# Patient Record
Sex: Female | Born: 1975 | Race: White | Hispanic: No | Smoking: Never smoker
Health system: Southern US, Community
[De-identification: ages and names within clinical notes are randomized; demographics above are authoritative.]

---

## 2009-06-11 ENCOUNTER — Emergency Department (HOSPITAL_BASED_OUTPATIENT_CLINIC_OR_DEPARTMENT_OTHER): Admission: EM | Admit: 2009-06-11 | Discharge: 2009-06-11 | Payer: Self-pay | Admitting: Emergency Medicine

## 2009-06-11 ENCOUNTER — Ambulatory Visit: Payer: Self-pay | Admitting: Diagnostic Radiology

## 2012-02-02 ENCOUNTER — Emergency Department (HOSPITAL_COMMUNITY)
Admission: EM | Admit: 2012-02-02 | Discharge: 2012-02-02 | Disposition: A | Discharge status: Home / Self Care | Payer: Self-pay | Attending: Emergency Medicine | Admitting: Emergency Medicine

## 2012-02-02 ENCOUNTER — Encounter (HOSPITAL_COMMUNITY): Payer: Self-pay | Admitting: *Deleted

## 2012-02-02 DIAGNOSIS — J302 Other seasonal allergic rhinitis: Secondary | ICD-10-CM

## 2012-02-02 DIAGNOSIS — H5710 Ocular pain, unspecified eye: Secondary | ICD-10-CM | POA: Insufficient documentation

## 2012-02-02 DIAGNOSIS — R070 Pain in throat: Secondary | ICD-10-CM | POA: Insufficient documentation

## 2012-02-02 DIAGNOSIS — Z79899 Other long term (current) drug therapy: Secondary | ICD-10-CM | POA: Insufficient documentation

## 2012-02-02 DIAGNOSIS — R0982 Postnasal drip: Secondary | ICD-10-CM | POA: Insufficient documentation

## 2012-02-02 DIAGNOSIS — H5789 Other specified disorders of eye and adnexa: Secondary | ICD-10-CM | POA: Insufficient documentation

## 2012-02-02 DIAGNOSIS — J309 Allergic rhinitis, unspecified: Secondary | ICD-10-CM | POA: Insufficient documentation

## 2012-02-02 DIAGNOSIS — H109 Unspecified conjunctivitis: Secondary | ICD-10-CM | POA: Insufficient documentation

## 2012-02-02 DIAGNOSIS — R6889 Other general symptoms and signs: Secondary | ICD-10-CM | POA: Insufficient documentation

## 2012-02-02 DIAGNOSIS — R51 Headache: Secondary | ICD-10-CM | POA: Insufficient documentation

## 2012-02-02 DIAGNOSIS — J3489 Other specified disorders of nose and nasal sinuses: Secondary | ICD-10-CM | POA: Insufficient documentation

## 2012-02-02 DIAGNOSIS — R220 Localized swelling, mass and lump, head: Secondary | ICD-10-CM | POA: Insufficient documentation

## 2012-02-02 MED ORDER — TOBRAMYCIN 0.3 % OP SOLN
2.0000 [drp] | Freq: Four times a day (QID) | OPHTHALMIC | Status: DC
  Administered 2012-02-02: 2 [drp] via OPHTHALMIC
  Filled 2012-02-02: qty 5

## 2012-02-02 MED ORDER — TRIAMCINOLONE ACETONIDE(NASAL) 55 MCG/ACT NA INHA: 2.0000 | Freq: Every day | NASAL | Status: DC

## 2012-02-02 MED ORDER — HYDROCODONE-ACETAMINOPHEN 5-325 MG PO TABS
1.0000 | ORAL_TABLET | Freq: Once | ORAL | Status: AC
  Administered 2012-02-02: 1 via ORAL
  Filled 2012-02-02: qty 1

## 2012-02-02 NOTE — ED Provider Notes (Signed)
History     CSN: [id number]  Arrival date & time 02/02/12  1152   First MD Initiated Contact with Patient 02/02/12 1238      Chief Complaint  Patient presents with  . Eye Pain    (Consider location/radiation/quality/duration/timing/severity/associated sxs/prior treatment) Patient is a 36 y.o. female presenting with eye pain. The history is provided by the patient.  Eye Pain This is a new problem. The current episode started in the past 7 days. The problem occurs constantly. The problem has been gradually worsening. Associated symptoms include congestion, headaches and a sore throat. Pertinent negatives include no coughing, fever or nausea. Associated symptoms comments: Right eye pain worse than left, but symptoms in both of redness, pain and morning drainage and crusting. No blurred vision. She reports headache behind eyes, congestion and sneezing typical of her allergy symptoms. No fever. Intermittent sore throat from "drainage". .    History reviewed. No pertinent past medical history.  Past Surgical History  Procedure Date  . Cesarean section     No family history on file.  History  Substance Use Topics  . Smoking status: Never Smoker   . Smokeless tobacco: Not on file  . Alcohol Use: Yes     occasionally    OB History    Grav Para Term Preterm Abortions TAB SAB Ect Mult Living                  Review of Systems  Constitutional: Negative for fever.  HENT: Positive for congestion, sore throat, sneezing, postnasal drip and sinus pressure.   Eyes: Positive for pain and discharge. Negative for visual disturbance.  Respiratory: Negative for cough.   Gastrointestinal: Negative for nausea.  Neurological: Positive for headaches.    Allergies  Review of patient's allergies indicates no known allergies.  Home Medications   Current Outpatient Rx  Name Route Sig Dispense Refill  . ASPIRIN-ACETAMINOPHEN-CAFFEINE 250-250-65 MG PO TABS Oral Take 1 tablet by mouth every  6 (six) hours as needed. pain    . DIPHENHYDRAMINE HCL 25 MG PO CAPS Oral Take 25 mg by mouth every 6 (six) hours as needed. allergies    . DIPHENHYDRAMINE-APAP (SLEEP) 25-500 MG PO TABS Oral Take 1 tablet by mouth at bedtime as needed.    Marland Kitchen NAPROXEN SODIUM 220 MG PO TABS Oral Take 220 mg by mouth 2 (two) times daily with a meal.    . TETRAHYDROZOLINE-ZN SULFATE 0.05-0.25 % OP SOLN Both Eyes Place 1 drop into both eyes 3 (three) times daily as needed. Red eyes      BP 105/75  Pulse 69  Temp(Src) 98.1 F (36.7 C) (Oral)  Resp 16  SpO2 100%  LMP 01/13/2012  Physical Exam  Constitutional: She is oriented to person, place, and time. She appears well-developed and well-nourished.  HENT:  Head: Normocephalic.  Eyes: Pupils are equal, round, and reactive to light. Right eye exhibits no discharge. Left eye exhibits no discharge.       Right eye significantly red wtihout drainage currently. Lids are not swollen. Nasal mucosa red and edematous. Sinus tenderness and increased pressure sensation with palpation.  Neck: Normal range of motion. Neck supple.  Cardiovascular: Normal rate and regular rhythm.   Pulmonary/Chest: Effort normal and breath sounds normal.  Abdominal: Soft. Bowel sounds are normal. There is no tenderness. There is no rebound and no guarding.  Musculoskeletal: Normal range of motion.  Neurological: She is alert and oriented to person, place, and time. She has normal strength  and normal reflexes. No sensory deficit. She displays a negative Romberg sign. Coordination normal.  Skin: Skin is warm and dry. No rash noted.  Psychiatric: She has a normal mood and affect.    ED Course  Procedures (including critical care time)  Labs Reviewed - No data to display No results found.   No diagnosis found. 1. conjunctivits 2. Seasonal allergic rhinitis    MDM  Patient with a history of significant allergies that mimic current symptoms with eye redness, morning crusting  consistent with conjunctivitis. Probably allergic, however, will treat based on significance of presentation with Tobrex, other supportive care recommendations.        Rodena Medin, PA-C 02/02/12 1415

## 2012-02-02 NOTE — ED Notes (Signed)
Started with right eye pain and redness 2 days ago. Has had drainage in the mornings. No known injury.

## 2012-02-02 NOTE — ED Notes (Signed)
Pt wears contacts and has them in at this time.

## 2012-02-02 NOTE — Discharge Instructions (Signed)
USE 2-3 DROPS INTO EYE (TOBREX) 4 TIMES DAILY FOR THE NEXT 3-4 DAYS. RECOMMEND PLAIN SALINE NASAL SPRAYS IN ADDITION TO NASACORT AS DIRECTED. CONTINUE ZYRTEC, RECOMMEND WITH DECONGESTANT. RETURN HERE WITH ANY HIGH FEVER, SEVERE PAIN OR NEW CONCERN. IT IS IMPORTANT TO LEAVE THE CONTACTS OUT FOR THE DURATION OF TREATMENT.   Allergic Rhinitis Allergic rhinitis is when the mucous membranes in the nose respond to allergens. Allergens are particles in the air that cause your body to have an allergic reaction. This causes you to release allergic antibodies. Through a chain of events, these eventually cause you to release histamine into the blood stream (hence the use of antihistamines). Although meant to be protective to the body, it is this release that causes your discomfort, such as frequent sneezing, congestion and an itchy runny nose.  CAUSES  The pollen allergens may come from grasses, trees, and weeds. This is seasonal allergic rhinitis, or "hay fever." Other allergens cause year-round allergic rhinitis (perennial allergic rhinitis) such as house dust mite allergen, pet dander and mold spores.  SYMPTOMS   Nasal stuffiness (congestion).   Runny, itchy nose with sneezing and tearing of the eyes.   There is often an itching of the mouth, eyes and ears.  It cannot be cured, but it can be controlled with medications. DIAGNOSIS  If you are unable to determine the offending allergen, skin or blood testing may find it. TREATMENT   Avoid the allergen.   Medications and allergy shots (immunotherapy) can help.   Hay fever may often be treated with antihistamines in pill or nasal spray forms. Antihistamines block the effects of histamine. There are over-the-counter medicines that may help with nasal congestion and swelling around the eyes. Check with your caregiver before taking or giving this medicine.  If the treatment above does not work, there are many new medications your caregiver can prescribe.  Stronger medications may be used if initial measures are ineffective. Desensitizing injections can be used if medications and avoidance fails. Desensitization is when a patient is given ongoing shots until the body becomes less sensitive to the allergen. Make sure you follow up with your caregiver if problems continue. SEEK MEDICAL CARE IF:   You develop fever (more than 100.5 F (38.1 C).   You develop a cough that does not stop easily (persistent).   You have shortness of breath.   You start wheezing.   Symptoms interfere with normal daily activities.  Document Released: 06/16/2001 Document Revised: 09/10/2011 Document Reviewed: 12/26/2008 [facility] Patient Information 2012 [place], [place].  Conjunctivitis Conjunctivitis is commonly called "pink eye." Conjunctivitis can be caused by bacterial or viral infection, allergies, or injuries. There is usually redness of the lining of the eye, itching, discomfort, and sometimes discharge. There may be deposits of matter along the eyelids. A viral infection usually causes a watery discharge, while a bacterial infection causes a yellowish, thick discharge. Pink eye is very contagious and spreads by direct contact. You may be given antibiotic eyedrops as part of your treatment. Before using your eye medicine, remove all drainage from the eye by washing gently with warm water and cotton balls. Continue to use the medication until you have awakened 2 mornings in a row without discharge from the eye. Do not rub your eye. This increases the irritation and helps spread infection. Use separate towels from other household members. Wash your hands with soap and water before and after touching your eyes. Use cold compresses to reduce pain and sunglasses to relieve irritation  from light. Do not wear contact lenses or wear eye makeup until the infection is gone. SEEK MEDICAL CARE IF:   Your symptoms are not better after 3 days of treatment.   You have increased  pain or trouble seeing.   The outer eyelids become very red or swollen.  Document Released: 10/29/2004 Document Revised: 09/10/2011 Document Reviewed: 09/21/2005 [facility] Patient Information 2012 [place], [place].

## 2012-02-02 NOTE — ED Provider Notes (Signed)
Medical screening examination/treatment/procedure(s) were performed by non-physician practitioner and as supervising physician I was immediately available for consultation/collaboration.   Nat Christen, MD 02/02/12 1524

## 2012-02-02 NOTE — ED Notes (Signed)
Pt reports bil eye pain, redness, itching, sensitivity to light x2 days. Reports eyes "crust over" when sleeping. Pain causing migraine.

## 2012-07-26 ENCOUNTER — Encounter (HOSPITAL_COMMUNITY): Payer: Self-pay | Admitting: *Deleted

## 2012-07-26 DIAGNOSIS — J3489 Other specified disorders of nose and nasal sinuses: Secondary | ICD-10-CM | POA: Insufficient documentation

## 2012-07-26 DIAGNOSIS — Z79899 Other long term (current) drug therapy: Secondary | ICD-10-CM | POA: Insufficient documentation

## 2012-07-26 DIAGNOSIS — IMO0001 Reserved for inherently not codable concepts without codable children: Secondary | ICD-10-CM | POA: Insufficient documentation

## 2012-07-26 DIAGNOSIS — R05 Cough: Secondary | ICD-10-CM | POA: Insufficient documentation

## 2012-07-26 DIAGNOSIS — J189 Pneumonia, unspecified organism: Secondary | ICD-10-CM | POA: Insufficient documentation

## 2012-07-26 DIAGNOSIS — R059 Cough, unspecified: Secondary | ICD-10-CM | POA: Insufficient documentation

## 2012-07-26 DIAGNOSIS — R509 Fever, unspecified: Secondary | ICD-10-CM | POA: Insufficient documentation

## 2012-07-26 DIAGNOSIS — N390 Urinary tract infection, site not specified: Secondary | ICD-10-CM | POA: Insufficient documentation

## 2012-07-26 LAB — CBC WITH DIFFERENTIAL/PLATELET
Basophils Absolute: 0 10*3/uL (ref 0.0–0.1)
Basophils Relative: 0 % (ref 0–1)
Eosinophils Absolute: 0.1 10*3/uL (ref 0.0–0.7)
Hemoglobin: 13.2 g/dL (ref 12.0–15.0)
MCH: 29.9 pg (ref 26.0–34.0)
MCHC: 34.2 g/dL (ref 30.0–36.0)
Monocytes Absolute: 1.6 10*3/uL — ABNORMAL HIGH (ref 0.1–1.0)
Monocytes Relative: 17 % — ABNORMAL HIGH (ref 3–12)
Neutro Abs: 5.8 10*3/uL (ref 1.7–7.7)
Neutrophils Relative %: 62 % (ref 43–77)
RDW: 12.5 % (ref 11.5–15.5)

## 2012-07-26 MED ORDER — IBUPROFEN 200 MG PO TABS
600.0000 mg | ORAL_TABLET | Freq: Once | ORAL | Status: AC
  Administered 2012-07-26: 600 mg via ORAL
  Filled 2012-07-26: qty 3

## 2012-07-26 NOTE — ED Notes (Signed)
Pt reports cough, congestion, fever x several days.  Took tylenol (unsure of amount) at 9pm tonight.  Pt noted to be coughing.

## 2012-07-27 ENCOUNTER — Emergency Department (HOSPITAL_COMMUNITY)
Admission: EM | Admit: 2012-07-27 | Discharge: 2012-07-27 | Disposition: A | Discharge status: Home / Self Care | Payer: Self-pay | Attending: Emergency Medicine | Admitting: Emergency Medicine

## 2012-07-27 ENCOUNTER — Emergency Department (HOSPITAL_COMMUNITY): Payer: Self-pay

## 2012-07-27 DIAGNOSIS — N390 Urinary tract infection, site not specified: Secondary | ICD-10-CM

## 2012-07-27 DIAGNOSIS — J189 Pneumonia, unspecified organism: Secondary | ICD-10-CM

## 2012-07-27 LAB — POCT I-STAT, CHEM 8
BUN: <3 mg/dL — ABNORMAL LOW (ref 6–23)
Calcium, Ion: 1.12 mmol/L (ref 1.12–1.23)
Creatinine, Ser: 0.8 mg/dL (ref 0.50–1.10)
Glucose, Bld: 99 mg/dL (ref 70–99)
Hemoglobin: 12.6 g/dL (ref 12.0–15.0)
TCO2: 24 mmol/L (ref 0–100)

## 2012-07-27 LAB — URINALYSIS, ROUTINE W REFLEX MICROSCOPIC
Bilirubin Urine: NEGATIVE
Hgb urine dipstick: NEGATIVE
Nitrite: NEGATIVE
Protein, ur: NEGATIVE mg/dL
Specific Gravity, Urine: 1.002 — ABNORMAL LOW (ref 1.005–1.030)
Urobilinogen, UA: 1 mg/dL (ref 0.0–1.0)

## 2012-07-27 MED ORDER — SODIUM CHLORIDE 0.9 % IV BOLUS (SEPSIS)
1000.0000 mL | Freq: Once | INTRAVENOUS | Status: AC
  Administered 2012-07-27: 1000 mL via INTRAVENOUS

## 2012-07-27 MED ORDER — LEVOFLOXACIN 750 MG PO TABS
750.0000 mg | ORAL_TABLET | Freq: Every day | ORAL | Status: DC
  Administered 2012-07-27: 750 mg via ORAL
  Filled 2012-07-27: qty 1

## 2012-07-27 MED ORDER — LEVOFLOXACIN 500 MG PO TABS: 500.0000 mg | ORAL_TABLET | Freq: Every day | ORAL | Status: AC

## 2012-07-27 MED ORDER — DEXTROSE 5 % IV SOLN: 1.0000 g | Freq: Once | INTRAVENOUS | Status: DC

## 2012-07-27 MED ORDER — HYDROCODONE-ACETAMINOPHEN 5-325 MG PO TABS
1.0000 | ORAL_TABLET | Freq: Once | ORAL | Status: AC
  Administered 2012-07-27: 1 via ORAL
  Filled 2012-07-27: qty 1

## 2012-07-27 MED ORDER — DEXTROSE 5 % IV SOLN
1.0000 g | Freq: Once | INTRAVENOUS | Status: AC
  Administered 2012-07-27: 1 g via INTRAVENOUS
  Filled 2012-07-27: qty 10

## 2012-07-27 MED ORDER — HYDROCODONE-ACETAMINOPHEN 5-500 MG PO TABS: 1.0000 | ORAL_TABLET | Freq: Four times a day (QID) | ORAL | Status: AC | PRN

## 2012-07-27 NOTE — ED Provider Notes (Signed)
History     CSN: [id number]  Arrival date & time 07/26/12  2212   First MD Initiated Contact with Patient 07/27/12 0036      Chief Complaint  Patient presents with  . URI    (Consider location/radiation/quality/duration/timing/severity/associated sxs/prior treatment) Patient is a 36 y.o. female presenting with URI. The history is provided by the patient.  URI The primary symptoms include fever, sore throat, cough and myalgias. The current episode started 3 to 5 days ago. This is a new problem. The problem has not changed since onset. The fever began 3 to 5 days ago. The fever has been unchanged since its onset. The maximum temperature recorded prior to her arrival was 102 to 102.9 F.  The sore throat began today. The sore throat has been unchanged since its onset. The sore throat is moderate in intensity.  The cough began yesterday. The cough is productive. The sputum is green and yellow.  Myalgias began 2 days ago. The myalgias have been unchanged since their onset. The myalgias are generalized. The discomfort from the myalgias is moderate.  Symptoms associated with the illness include chills and congestion.    History reviewed. No pertinent past medical history.  Past Surgical History  Procedure Date  . Cesarean section     History reviewed. No pertinent family history.  History  Substance Use Topics  . Smoking status: Never Smoker   . Smokeless tobacco: Not on file  . Alcohol Use: Yes     occasionally    OB History    Grav Para Term Preterm Abortions TAB SAB Ect Mult Living                  Review of Systems  Constitutional: Positive for fever and chills.  HENT: Positive for congestion and sore throat.   Respiratory: Positive for cough.   Musculoskeletal: Positive for myalgias.  All other systems reviewed and are negative.    Allergies  Review of patient's allergies indicates no known allergies.  Home Medications   Current Outpatient Rx  Name Route  Sig Dispense Refill  . ACETAMINOPHEN 500 MG PO TABS Oral Take 500 mg by mouth every 6 (six) hours as needed. For cold symptoms.    . DEXTROMETHORPHAN POLISTIREX ER 30 MG/5ML PO LQCR Oral Take 60 mg by mouth as needed. For cold symptoms.    Ronney Asters COLD PO Oral Take 10 mLs by mouth daily as needed. For cold symptoms.    Marland Kitchen MUCINEX D PO Oral Take 10 mLs by mouth daily as needed. For cold symptoms and congestion.      BP 105/72  Pulse 131  Temp 102.9 F (39.4 C) (Oral)  Resp 22  SpO2 100%  LMP 07/02/2012  Physical Exam  Constitutional: She is oriented to person, place, and time. She appears well-developed and well-nourished.  HENT:  Head: Normocephalic and atraumatic.  Eyes: Conjunctivae normal and EOM are normal. Pupils are equal, round, and reactive to light.  Neck: Normal range of motion.  Cardiovascular: Regular rhythm and normal heart sounds.  Tachycardia present.   Pulmonary/Chest: Effort normal and breath sounds normal.  Abdominal: Soft. Bowel sounds are normal.  Musculoskeletal: Normal range of motion.  Neurological: She is alert and oriented to person, place, and time.  Skin: Skin is warm and dry.  Psychiatric: She has a normal mood and affect. Her behavior is normal.    ED Course  Procedures (including critical care time)  Labs Reviewed  CBC WITH DIFFERENTIAL - Abnormal;  Notable for the following:    Monocytes Relative 17 (*)     Monocytes Absolute 1.6 (*)     All other components within normal limits  POCT I-STAT, CHEM 8 - Abnormal; Notable for the following:    Potassium 3.1 (*)     BUN <3 (*)     All other components within normal limits  PREGNANCY, URINE  URINALYSIS, ROUTINE W REFLEX MICROSCOPIC   No results found.   No diagnosis found.    MDM  + uri sxs,  Tachcyardia,  Fever, normal cbc. A wait cxr,  Ivf, antipyretic.  Will reassess tahcycardia improved.  + uti,  Pneumonia.  Will treat,  Dc to fu,  Ret new/worsening sxs       Nayra Coury Lytle Michaels, MD 07/27/12 0410

## 2012-07-27 NOTE — ED Notes (Signed)
Pt and family updated on wait time. Pt A&Ox4 in no acute distress.

## 2012-07-27 NOTE — ED Notes (Signed)
Pt dc to home.  Pt ambulatory to exit without difficulty.  States will take meds and will f/u with pcp as needed.

## 2012-07-29 LAB — URINE CULTURE: Colony Count: >=100000

## 2012-07-30 NOTE — ED Notes (Signed)
+  Urine. Patient given Levofloxacin. Resistant. Chart sent to EDP office for review.

## 2012-07-31 NOTE — ED Notes (Signed)
Chart returned from EDP office. Prescribed Keflex 500 mg bid x 10 days. #20. No refills. Prescribed by Arthor Captain PA-C.

## 2012-08-01 ENCOUNTER — Telehealth (HOSPITAL_COMMUNITY): Payer: Self-pay | Admitting: Emergency Medicine

## 2012-08-28 NOTE — ED Notes (Signed)
No response to letter sent. Chart sent to Medical Records per protocol.

## 2012-08-28 NOTE — ED Notes (Signed)
Unable to contact patient by phone. Sent letter. °

## 2014-11-12 ENCOUNTER — Emergency Department (HOSPITAL_COMMUNITY): Payer: Medicaid Other

## 2014-11-12 ENCOUNTER — Encounter (HOSPITAL_COMMUNITY): Payer: Self-pay

## 2014-11-12 ENCOUNTER — Emergency Department (HOSPITAL_COMMUNITY)
Admission: EM | Admit: 2014-11-12 | Discharge: 2014-11-13 | Disposition: A | Discharge status: Home / Self Care | Payer: Medicaid Other | Attending: Emergency Medicine | Admitting: Emergency Medicine

## 2014-11-12 DIAGNOSIS — R197 Diarrhea, unspecified: Secondary | ICD-10-CM | POA: Insufficient documentation

## 2014-11-12 DIAGNOSIS — Z79899 Other long term (current) drug therapy: Secondary | ICD-10-CM | POA: Insufficient documentation

## 2014-11-12 DIAGNOSIS — B349 Viral infection, unspecified: Secondary | ICD-10-CM | POA: Insufficient documentation

## 2014-11-12 DIAGNOSIS — Z792 Long term (current) use of antibiotics: Secondary | ICD-10-CM | POA: Insufficient documentation

## 2014-11-12 DIAGNOSIS — R079 Chest pain, unspecified: Secondary | ICD-10-CM

## 2014-11-12 DIAGNOSIS — Z3202 Encounter for pregnancy test, result negative: Secondary | ICD-10-CM | POA: Insufficient documentation

## 2014-11-12 DIAGNOSIS — R630 Anorexia: Secondary | ICD-10-CM | POA: Diagnosis not present

## 2014-11-12 DIAGNOSIS — H9209 Otalgia, unspecified ear: Secondary | ICD-10-CM | POA: Diagnosis not present

## 2014-11-12 DIAGNOSIS — Z7982 Long term (current) use of aspirin: Secondary | ICD-10-CM | POA: Diagnosis not present

## 2014-11-12 LAB — I-STAT CHEM 8, ED
BUN: 5 mg/dL — AB (ref 6–23)
Calcium, Ion: 1.02 mmol/L — ABNORMAL LOW (ref 1.12–1.23)
Chloride: 103 mmol/L (ref 96–112)
Creatinine, Ser: 0.7 mg/dL (ref 0.50–1.10)
GLUCOSE: 104 mg/dL — AB (ref 70–99)
HEMATOCRIT: 39 % (ref 36.0–46.0)
Hemoglobin: 13.3 g/dL (ref 12.0–15.0)
Potassium: 3.1 mmol/L — ABNORMAL LOW (ref 3.5–5.1)
Sodium: 140 mmol/L (ref 135–145)
TCO2: 20 mmol/L (ref 0–100)

## 2014-11-12 LAB — I-STAT TROPONIN, ED: TROPONIN I, POC: 0 ng/mL (ref 0.00–0.08)

## 2014-11-12 MED ORDER — KETOROLAC TROMETHAMINE 30 MG/ML IJ SOLN
30.0000 mg | Freq: Once | INTRAMUSCULAR | Status: AC
  Administered 2014-11-12: 30 mg via INTRAVENOUS
  Filled 2014-11-12: qty 1

## 2014-11-12 MED ORDER — SODIUM CHLORIDE 0.9 % IV BOLUS (SEPSIS)
1000.0000 mL | INTRAVENOUS | Status: AC
  Administered 2014-11-12: 1000 mL via INTRAVENOUS

## 2014-11-12 NOTE — ED Notes (Signed)
Pt complains of general body aches, sore throat and earache for one week, pt's heartrate is elevated and her blood pressure is elevated.

## 2014-11-12 NOTE — ED Notes (Signed)
Pt off the floor in xray.

## 2014-11-12 NOTE — ED Provider Notes (Signed)
CSN: [id number]     Arrival date & time 11/12/14  1946 History   First MD Initiated Contact with Patient 11/12/14 2240     Chief Complaint  Patient presents with  . Generalized Body Aches     (Consider location/radiation/quality/duration/timing/severity/associated sxs/prior Treatment) Patient is a 39 y.o. female presenting with chest pain. The history is provided by the patient.  Chest Pain Chest pain location: central. Pain quality: dull and sharp   Pain radiates to:  Does not radiate Pain radiates to the back: no   Pain severity:  Mild Onset quality:  Gradual Timing:  Constant Progression:  Unchanged Chronicity:  New Context: at rest   Relieved by:  Nothing Worsened by:  Nothing tried Ineffective treatments:  None tried Associated symptoms: cough, fever, nausea, shortness of breath and vomiting   Associated symptoms: no abdominal pain, no back pain, no dizziness, no fatigue and no headache   Cough:    Cough characteristics:  Productive   Sputum characteristics:  Green   Severity:  Mild Fever:    Timing:  Intermittent   History reviewed. No pertinent past medical history. Past Surgical History  Procedure Laterality Date  . Cesarean section     History reviewed. No pertinent family history. History  Substance Use Topics  . Smoking status: Never Smoker   . Smokeless tobacco: Not on file  . Alcohol Use: Yes     Comment: occasionally   OB History    No data available     Review of Systems  Constitutional: Positive for fever, chills and appetite change. Negative for fatigue.  HENT: Positive for ear pain and sore throat. Negative for congestion and drooling.   Eyes: Negative for pain.  Respiratory: Positive for cough and shortness of breath.   Cardiovascular: Negative for chest pain.  Gastrointestinal: Positive for nausea, vomiting and diarrhea. Negative for abdominal pain.  Genitourinary: Negative for dysuria and hematuria.  Musculoskeletal: Negative for back  pain, gait problem and neck pain.  Skin: Negative for color change.  Neurological: Negative for dizziness and headaches.  Hematological: Negative for adenopathy.  Psychiatric/Behavioral: Negative for behavioral problems.  All other systems reviewed and are negative.     Allergies  Review of patient's allergies indicates no known allergies.  Home Medications   Prior to Admission medications   Medication Sig Start Date End Date Taking? Authorizing Provider  acetaminophen (TYLENOL) 500 MG tablet Take 500 mg by mouth every 6 (six) hours as needed for moderate pain (pain). For cold symptoms.   Yes Historical Provider, MD  aspirin 81 MG tablet Take 81 mg by mouth daily as needed for pain (pain).   Yes Historical Provider, MD  Phenylephrine-Pheniramine-DM [facility] COLD & COUGH PO) Take 30 mLs by mouth every 6 (six) hours as needed (cold symptoms).   Yes Historical Provider, MD  Pseudoeph-CPM-DM-APAP (TYLENOL COLD PO) Take 10 mLs by mouth daily as needed (cold symptoms). For cold symptoms.   Yes Historical Provider, MD  Pseudoephedrine-Guaifenesin (MUCINEX D PO) Take 10 mLs by mouth daily as needed (congestion).    Yes Historical Provider, MD  dextromethorphan (DELSYM) 30 MG/5ML liquid Take 60 mg by mouth as needed. For cold symptoms.    Historical Provider, MD  HYDROcodone-acetaminophen (VICODIN) 5-500 MG per tablet Take 1-2 tablets by mouth every 6 (six) hours as needed for pain. 07/27/12   Chionesu Lytle Michaels, MD  levofloxacin (LEVAQUIN) 500 MG tablet Take 1 tablet (500 mg total) by mouth daily. Patient not taking: Reported on 11/12/2014 07/27/12  Chionesu Lytle MichaelsK Sonyika, MD   BP 108/67 mmHg  Pulse 121  Temp(Src) 98.8 F (37.1 C) (Oral)  Resp 28  Ht 5\' 1"  (1.549 m)  Wt 140 lb (63.504 kg)  BMI 26.47 kg/m2  SpO2 97%  LMP 10/17/2014 Physical Exam  Constitutional: She is oriented to person, place, and time. She appears well-developed and well-nourished.  HENT:  Head: Normocephalic.   Mouth/Throat: No oropharyngeal exudate.  Mild erythema of posterior oropharynx. No exudate noted. No uvular deviation or significant tonsillar swelling. No trismus. Normal range of motion of the neck.  Eyes: Conjunctivae and EOM are normal. Pupils are equal, round, and reactive to light.  Neck: Normal range of motion. Neck supple.  Cardiovascular: Regular rhythm, normal heart sounds and intact distal pulses.  Exam reveals no gallop and no friction rub.   No murmur heard. HR 120's  Pulmonary/Chest: Effort normal and breath sounds normal. No respiratory distress. She has no wheezes.  Abdominal: Soft. Bowel sounds are normal. There is no tenderness. There is no rebound and no guarding.  Musculoskeletal: Normal range of motion. She exhibits no edema or tenderness.  Neurological: She is alert and oriented to person, place, and time.  Skin: Skin is warm and dry.  Psychiatric: She has a normal mood and affect. Her behavior is normal.  Nursing note and vitals reviewed.   ED Course  Procedures (including critical care time) Labs Review Labs Reviewed  PREGNANCY, URINE  I-STAT CHEM 8, ED  I-STAT TROPOININ, ED    Imaging Review No results found.   EKG Interpretation   Date/Time:  Monday November 12 2014 19:53:57 EST Ventricular Rate:  121 PR Interval:  131 QRS Duration: 86 QT Interval:  299 QTC Calculation: 424 R Axis:   36 Text Interpretation:  Sinus tachycardia Low voltage, precordial leads  Borderline T abnormalities, anterior leads Confirmed by Ataya Murdy  MD,  Jovoni Borkenhagen ([id number]) on 11/12/2014 11:05:39 PM      MDM   Final diagnoses:  Chest pain  Viral syndrome    11:12 PM 39 y.o. female who presents with multiple symptoms. She states that she has had a cough, sore throat, congestion, intermittent fever, body aches, vomiting, diarrhea, chills for about 1 week. She states that she developed some chest pain and mild shortness of breath today. She is afebrile and mildly tachycardic  here. She states her chest pain is central sharp/dull and constant in nature. Likely related to concurrent viral URI. Centor score of 1 and no further workup part for hoarse or throat. Will get screening labs and imaging. Hx of tubal ligation. No GU sx.   12:05 AM: HR dec w/ IVF. Pt w/ some symptomatic relief on exam. Labs/imaging non-contrib. CP likely relate to coughing and viral syndrome. Low risk for MACE per HEART score. Do not think this is a PE given concurrent viral sx for 1 week.  I have discussed the diagnosis/risks/treatment options with the patient and family and believe the pt to be eligible for discharge home to follow-up with her pcp. We also discussed returning to the ED immediately if new or worsening sx occur. We discussed the sx which are most concerning (e.g., worsening sob, intractable fever, worsening cp) that necessitate immediate return. Medications administered to the patient during their visit and any new prescriptions provided to the patient are listed below.  Medications given during this visit Medications  sodium chloride 0.9 % bolus 1,000 mL (1,000 mLs Intravenous New Bag/Given 11/12/14 2328)  ketorolac (TORADOL) 30 MG/ML injection 30 mg (  30 mg Intravenous Given 11/12/14 2328)    Discharge Medication List as of 11/13/2014 12:06 AM    START taking these medications   Details  ondansetron (ZOFRAN ODT) 4 MG disintegrating tablet  ODT q4 hours prn nausea/vomit, Print    oxyCODONE-acetaminophen (PERCOCET) 5-325 MG per tablet Take 1 tablet by mouth every 8 (eight) hours as needed for moderate pain., Starting 11/13/2014, Until Discontinued, Print         Purvis Sheffield, MD 11/13/14 1640

## 2014-11-13 MED ORDER — ONDANSETRON HCL 4 MG/2ML IJ SOLN
4.0000 mg | Freq: Once | INTRAMUSCULAR | Status: AC
  Administered 2014-11-13: 4 mg via INTRAVENOUS
  Filled 2014-11-13: qty 2

## 2014-11-13 MED ORDER — MORPHINE SULFATE 4 MG/ML IJ SOLN
6.0000 mg | Freq: Once | INTRAMUSCULAR | Status: AC
  Administered 2014-11-13: 6 mg via INTRAVENOUS
  Filled 2014-11-13: qty 2

## 2014-11-13 MED ORDER — ONDANSETRON 4 MG PO TBDP: ORAL_TABLET | ORAL | Status: AC

## 2014-11-13 MED ORDER — OXYCODONE-ACETAMINOPHEN 5-325 MG PO TABS: 1.0000 | ORAL_TABLET | Freq: Three times a day (TID) | ORAL | Status: AC | PRN

## 2015-08-09 IMAGING — CR DG CHEST 2V
2 series · 2 of 2 positions shown · non-contrast
Comparison: Chest radiograph performed 07/27/2012

CLINICAL DATA: Mid upper chest pain and cough, acute onset.
Vomiting, sore throat and diarrhea. Initial encounter.

EXAM:
CHEST  2 VIEW

[w chest pa]
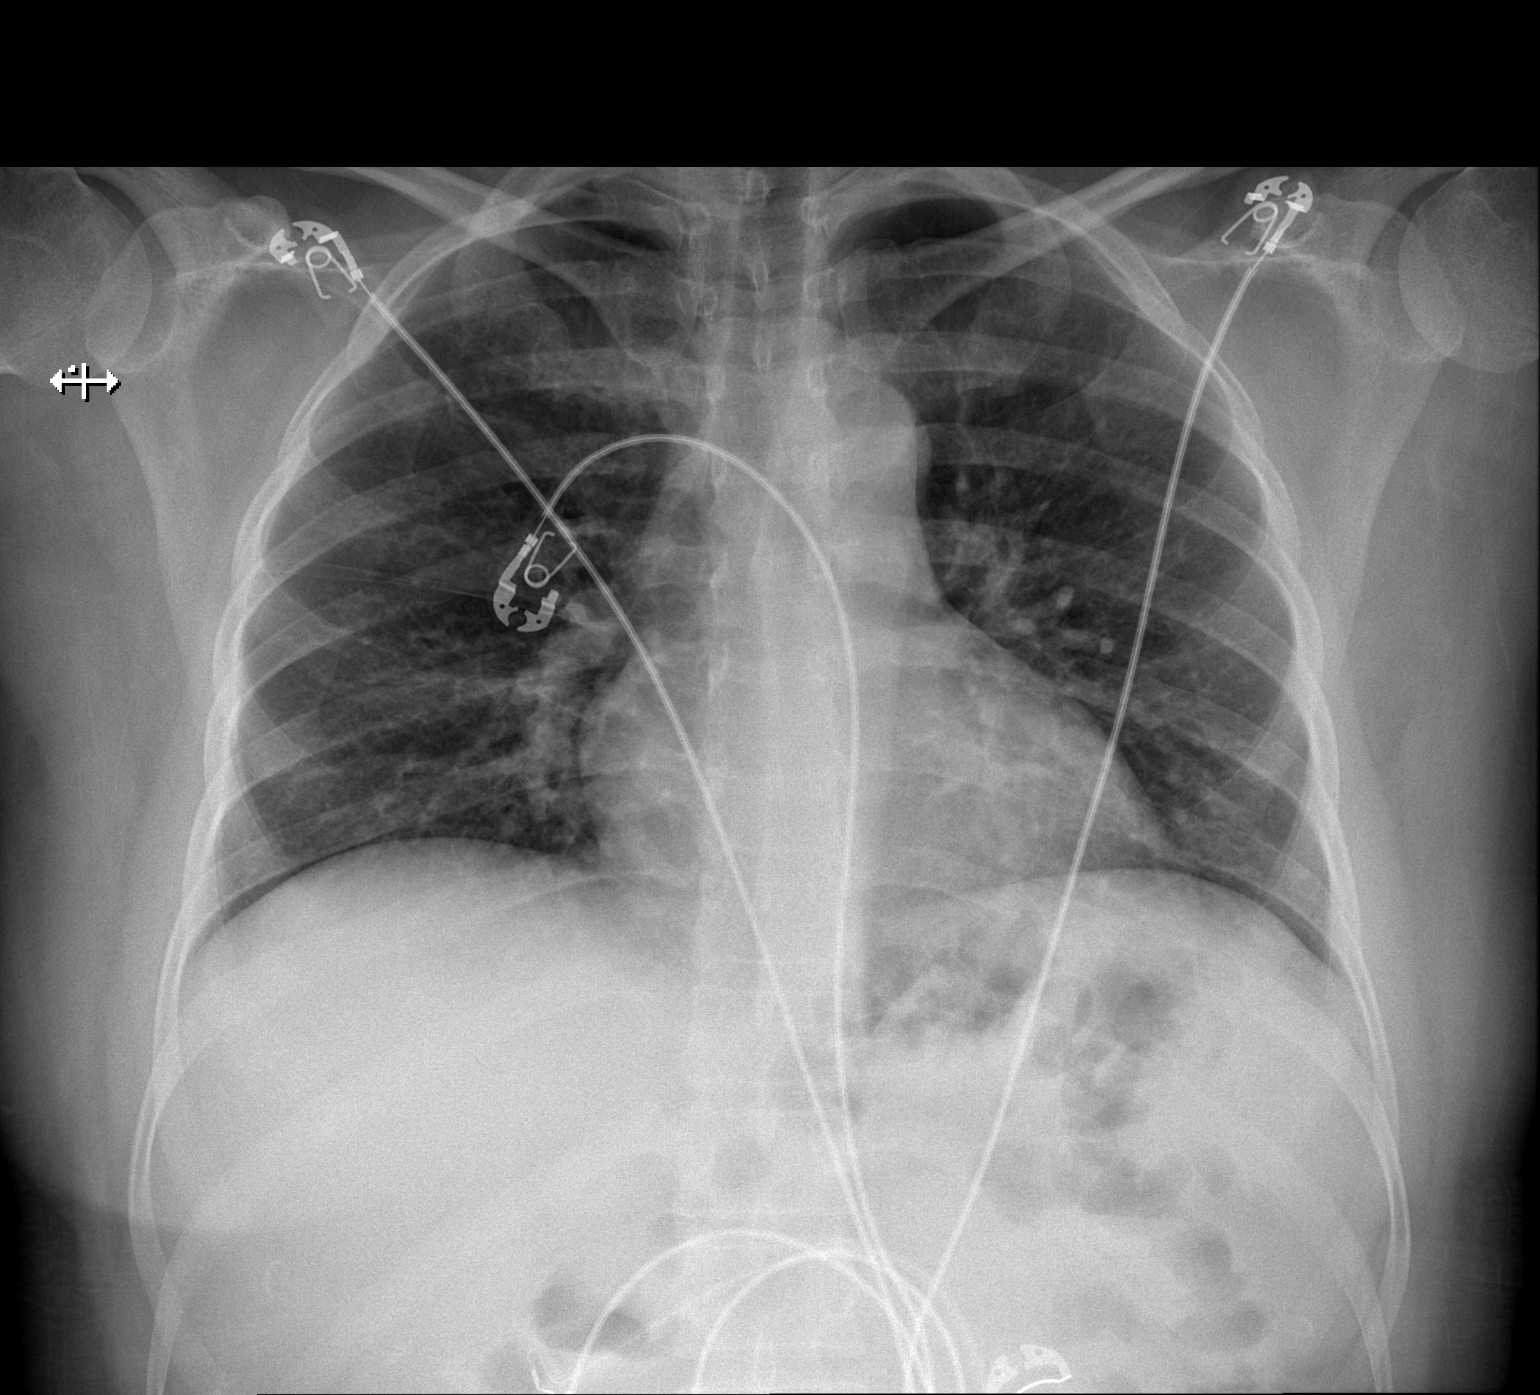

[w chest lat]
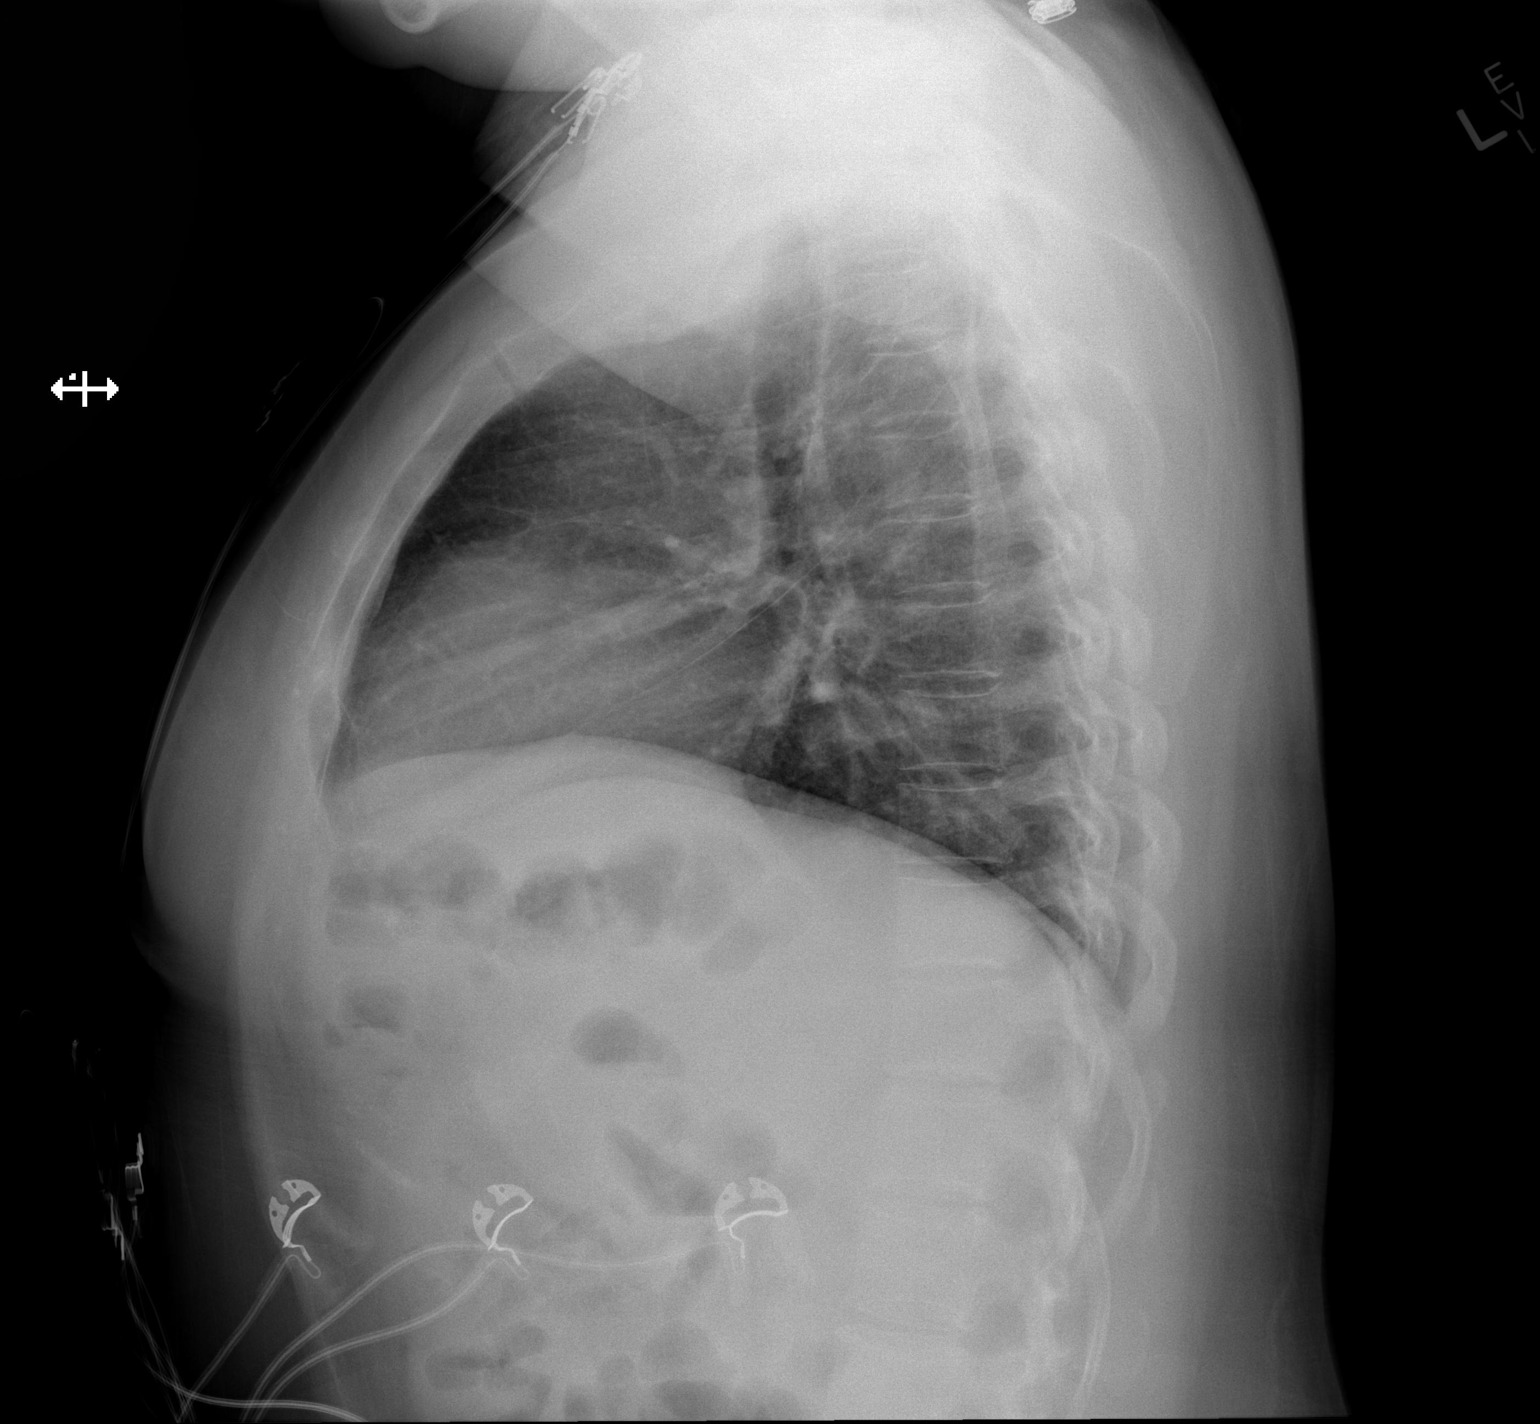

[2 of 2 positions shown; findings below may reference images not displayed]

FINDINGS: The lungs are relatively well-aerated. Minimal bibasilar atelectasis
is noted. There is no evidence of focal opacification, pleural
effusion or pneumothorax.

The heart is normal in size; the mediastinal contour is within
normal limits. No acute osseous abnormalities are seen.
IMPRESSION: Minimal bibasilar atelectasis noted; lungs otherwise clear.
